# Patient Record
Sex: Female | Born: 1985 | Race: Black or African American | Hispanic: No | Marital: Single | Smoking: Never smoker
Health system: Southern US, Community
[De-identification: ages and names within clinical notes are randomized; demographics above are authoritative.]

## PROBLEM LIST (undated history)

## (undated) DIAGNOSIS — J45909 Unspecified asthma, uncomplicated: Secondary | ICD-10-CM

## (undated) DIAGNOSIS — I10 Essential (primary) hypertension: Secondary | ICD-10-CM

---

## 2019-09-24 ENCOUNTER — Other Ambulatory Visit: Payer: Self-pay | Admitting: Physician Assistant

## 2019-09-24 DIAGNOSIS — E041 Nontoxic single thyroid nodule: Secondary | ICD-10-CM

## 2019-10-07 ENCOUNTER — Other Ambulatory Visit: Payer: Self-pay

## 2019-10-07 ENCOUNTER — Ambulatory Visit
Admission: RE | Admit: 2019-10-07 | Discharge: 2019-10-07 | Disposition: A | Discharge status: Home / Self Care | Payer: Self-pay | Source: Ambulatory Visit | Attending: Physician Assistant | Admitting: Physician Assistant

## 2019-10-07 DIAGNOSIS — E041 Nontoxic single thyroid nodule: Secondary | ICD-10-CM | POA: Insufficient documentation

## 2019-11-03 ENCOUNTER — Emergency Department: Payer: Medicaid Other

## 2019-11-03 ENCOUNTER — Other Ambulatory Visit: Payer: Self-pay

## 2019-11-03 ENCOUNTER — Encounter: Payer: Self-pay | Admitting: Intensive Care

## 2019-11-03 ENCOUNTER — Emergency Department
Admission: EM | Admit: 2019-11-03 | Discharge: 2019-11-03 | Disposition: A | Discharge status: Home / Self Care | Payer: Medicaid Other | Attending: Emergency Medicine | Admitting: Emergency Medicine

## 2019-11-03 DIAGNOSIS — Z20822 Contact with and (suspected) exposure to covid-19: Secondary | ICD-10-CM | POA: Insufficient documentation

## 2019-11-03 DIAGNOSIS — R079 Chest pain, unspecified: Secondary | ICD-10-CM | POA: Insufficient documentation

## 2019-11-03 DIAGNOSIS — I10 Essential (primary) hypertension: Secondary | ICD-10-CM | POA: Insufficient documentation

## 2019-11-03 DIAGNOSIS — J45909 Unspecified asthma, uncomplicated: Secondary | ICD-10-CM | POA: Insufficient documentation

## 2019-11-03 DIAGNOSIS — Z79899 Other long term (current) drug therapy: Secondary | ICD-10-CM | POA: Insufficient documentation

## 2019-11-03 HISTORY — DX: Unspecified asthma, uncomplicated: J45.909

## 2019-11-03 HISTORY — DX: Essential (primary) hypertension: I10

## 2019-11-03 LAB — SARS CORONAVIRUS 2 BY RT PCR (HOSPITAL ORDER, PERFORMED IN ~~LOC~~ HOSPITAL LAB): SARS Coronavirus 2: NEGATIVE

## 2019-11-03 LAB — BASIC METABOLIC PANEL
Anion gap: 11 (ref 5–15)
BUN: 20 mg/dL (ref 6–20)
CO2: 25 mmol/L (ref 22–32)
Calcium: 9.6 mg/dL (ref 8.9–10.3)
Chloride: 101 mmol/L (ref 98–111)
Creatinine, Ser: 1.43 mg/dL — ABNORMAL HIGH (ref 0.44–1.00)
GFR calc Af Amer: 55 mL/min — ABNORMAL LOW (ref >60)
GFR calc non Af Amer: 48 mL/min — ABNORMAL LOW (ref >60)
Glucose, Bld: 104 mg/dL — ABNORMAL HIGH (ref 70–99)
Potassium: 3.8 mmol/L (ref 3.5–5.1)
Sodium: 137 mmol/L (ref 135–145)

## 2019-11-03 LAB — CBC
HCT: 39.2 % (ref 36.0–46.0)
Hemoglobin: 12.8 g/dL (ref 12.0–15.0)
MCH: 26.6 pg (ref 26.0–34.0)
MCHC: 32.7 g/dL (ref 30.0–36.0)
MCV: 81.5 fL (ref 80.0–100.0)
Platelets: 199 10*3/uL (ref 150–400)
RBC: 4.81 MIL/uL (ref 3.87–5.11)
RDW: 16.4 % — ABNORMAL HIGH (ref 11.5–15.5)
WBC: 5.6 10*3/uL (ref 4.0–10.5)
nRBC: 0 % (ref 0.0–0.2)

## 2019-11-03 LAB — TROPONIN I (HIGH SENSITIVITY): Troponin I (High Sensitivity): 5 ng/L (ref <18)

## 2019-11-03 MED ORDER — ACETAMINOPHEN 500 MG PO TABS
1000.0000 mg | ORAL_TABLET | Freq: Once | ORAL | Status: AC
  Administered 2019-11-03: 1000 mg via ORAL
  Filled 2019-11-03: qty 2

## 2019-11-03 NOTE — ED Provider Notes (Signed)
[facility] Emergency Department Provider Note  ____________________________________________   First MD Initiated Contact with Patient 11/03/19 1712     (approximate)  I have reviewed the triage vital signs and the nursing notes.   HISTORY  Chief Complaint Chest Pain   HPI Nancy Copeland is a 34 y.o. female with a past medical history of asthma, hypertension, and amenorrhea of unknown etiology who presents for assessment of substernal sharp chest pain that began yesterday approximately p.m. Patient states he has had prior similar episodes but she feels that this pain has been more persistent than usual. She states it is aggravated by any movement or twisting or talking. She also notes she was recently diagnosed with laryngitis and has been worse for a while. She denies any fevers, chills, cough, shortness of breath, nausea, vomiting, diarrhea, dysuria, abdominal pain, back pain, rash, recent traumatic injuries. She states her triptan is just left external and does not radiate to her arms, jaw, back, or elsewhere. No clear alleviating aggravating factors. Patient denies tobacco abuse, EtOH use, illicit drug use. Does not know if she has any strong family history of CAD. Is not currently on any estrogen supplementation. No history of DVT/PE. HPI: A 34 year old patient with a history of hypertension and obesity presents for evaluation of chest pain. Initial onset of pain was more than 6 hours ago. The patient's chest pain is sharp and is not worse with exertion. The patient's chest pain is middle- or left-sided, is not well-localized, is not described as heaviness/pressure/tightness and does not radiate to the arms/jaw/neck. The patient does not complain of nausea and denies diaphoresis. The patient has no history of stroke, has no history of peripheral artery disease, has not smoked in the past 90 days, denies any history of treated diabetes, has no relevant family history of  coronary artery disease (first degree relative at less than age 53) and has no history of hypercholesterolemia.        Past Medical History:  Diagnosis Date  . Asthma   . Hypertension     There are no problems to display for this patient.   History reviewed. No pertinent surgical history.  Prior to Admission medications   Medication Sig Start Date End Date Taking? Authorizing Provider  albuterol (VENTOLIN HFA) 108 (90 Base) MCG/ACT inhaler Inhale 2 puffs into the lungs every 6 (six) hours as needed for wheezing or shortness of breath.   Yes [provider]  amLODipine (NORVASC) 10 MG tablet Take 10 mg by mouth daily.   Yes [provider]  doxepin (SINEQUAN) 50 MG capsule Take 50 mg by mouth.   Yes [provider]  hydrochlorothiazide (MICROZIDE) 12.5 MG capsule Take 12.5 mg by mouth daily.   Yes [provider]  lisinopril (ZESTRIL) 40 MG tablet Take 40 mg by mouth daily.   Yes [provider]  metoprolol succinate (TOPROL-XL) 50 MG 24 hr tablet Take 50 mg by mouth daily. Take with or immediately following a meal.   Yes [provider]    Allergies Reglan [metoclopramide]  History reviewed. No pertinent family history.  Social History Social History   Tobacco Use  . Smoking status: Never Smoker  . Smokeless tobacco: Never Used  Substance Use Topics  . Alcohol use: Never  . Drug use: Never    Review of Systems  Review of Systems  Constitutional: Negative for chills and fever.  HENT: Negative for sore throat.   Eyes: Negative for pain.  Respiratory:  Negative for cough and stridor.   Cardiovascular: Positive for chest pain.  Gastrointestinal: Negative for vomiting.  Skin: Negative for rash.  Neurological: Negative for seizures, loss of consciousness and headaches.  Psychiatric/Behavioral: Negative for suicidal ideas.  All other systems reviewed and are negative.      ____________________________________________   PHYSICAL EXAM:  VITAL SIGNS: ED Triage Vitals [11/03/19 1533]  Enc Vitals Group     BP (!) 154/92     Pulse Rate 91     Resp 16     Temp 99.2 F (37.3 C)     Temp Source Oral     SpO2 100 %     Weight 204 lb (92.5 kg)     Height 5\' 4"  (1.626 m)     Head Circumference      Peak Flow      Pain Score 8     Pain Loc      Pain Edu?      Excl. in GC?    Vitals:   11/03/19 1533  BP: (!) 154/92  Pulse: 91  Resp: 16  Temp: 99.2 F (37.3 C)  SpO2: 100%   Physical Exam Vitals and nursing note reviewed.  Constitutional:      General: She is not in acute distress.    Appearance: She is well-developed. She is obese.  HENT:     Head: Normocephalic and atraumatic.     Right Ear: External ear normal.     Left Ear: External ear normal.     Nose: Nose normal.     Mouth/Throat:     Mouth: Mucous membranes are moist.  Eyes:     Conjunctiva/sclera: Conjunctivae normal.  Cardiovascular:     Rate and Rhythm: Normal rate and regular rhythm.     Pulses: Normal pulses.     Heart sounds: No murmur heard.   Pulmonary:     Effort: Pulmonary effort is normal. No respiratory distress.     Breath sounds: Normal breath sounds.  Abdominal:     Palpations: Abdomen is soft.     Tenderness: There is no abdominal tenderness.  Musculoskeletal:     Cervical back: Neck supple. No rigidity.  Skin:    General: Skin is warm and dry.     Capillary Refill: Capillary refill takes less than 2 seconds.  Neurological:     General: No focal deficit present.     Mental Status: She is alert and oriented to person, place, and time.  Psychiatric:        Mood and Affect: Mood normal.      ____________________________________________   LABS (all labs ordered are listed, but only abnormal results are displayed)  Labs Reviewed  BASIC METABOLIC PANEL - Abnormal; Notable for the following components:      Result Value   Glucose, Bld 104 (*)     Creatinine, Ser 1.43 (*)    GFR calc non Af Amer 48 (*)    GFR calc Af Amer 55 (*)    All other components within normal limits  CBC - Abnormal; Notable for the following components:   RDW 16.4 (*)    All other components within normal limits  SARS CORONAVIRUS 2 BY RT PCR (HOSPITAL ORDER, PERFORMED IN [facility])  POC URINE PREG, ED  TROPONIN I (HIGH SENSITIVITY)   ________ urine pregnancy test noted to be negative.  ____________________________________  EKG  Normal sinus rhythm with a ventricular rate of 87, normal axis, unremarkable intervals, nonspecific  T wave changes in lead III with no other evidence of acute ischemia or other underlying arrhythmia. ____________________________________________  RADIOLOGY  ED MD interpretation: Unremarkable.  Official radiology report(s): DG Chest 2 View  Result Date: 11/03/2019 CLINICAL DATA:  Chest pain EXAM: CHEST - 2 VIEW COMPARISON:  None. FINDINGS: Lungs are clear. Heart size and pulmonary vascularity are normal. No adenopathy. No pneumothorax. No bone lesions. IMPRESSION: Lungs clear.  Cardiac silhouette within normal limits. Electronically Signed   By: Bretta Bang III M.D.   On: 11/03/2019 16:20    ____________________________________________   PROCEDURES  Procedure(s) performed (including Critical Care):  Procedures   ____________________________________________   INITIAL IMPRESSION / ASSESSMENT AND PLAN / ED COURSE  HEAR Score: 2   Overall patient's history, exam, and ED work-up is most consistent with nonspecific chest wall pain with MSK versus costochondritis with the differentials versus pleurisy.  Patient has no cough, fever, or chest x-ray findings suggestive of pneumonia.  Low suspicion for ACS given patient's here score is 2 with reassuring EKG and nonelevated opponent note was obtained greater than 3 hours after symptom onset.  No rash or history of recent traumatic injuries.  Presentation is not  consistent with dissection.  Low suspicion for PE as patient is PERC negative.  COVID-19 sent.  No evidence of significant electrolyte or metabolic derangements on labs and patient does not appear to be acutely symptomatic from anemia.  Discussed OTC analgesia patient was given Tylenol in ED.  Given stable vital signs with otherwise reassuring work-up you believe patient safe to discharge with plan for close outpatient follow-up.  Discharged stable condition.  Strict return precautions advised and discussed with  Medications  acetaminophen (TYLENOL) tablet 1,000 mg (1,000 mg Oral Given 11/03/19 1757)      ____________________________________________   FINAL CLINICAL IMPRESSION(S) / ED DIAGNOSES  Final diagnoses:  Chest pain, unspecified type  Person under investigation for COVID-19     ED Discharge Orders    None       Note:  This document was prepared using Dragon voice recognition software and may include unintentional dictation errors.   Gilles Chiquito, MD 11/03/19 [phone]

## 2019-11-03 NOTE — ED Triage Notes (Signed)
Patient c/o stabbing chest pain that started last night around 8pm. No radiation. Reports the pain happens with any movement and talking. Denies N/V.

## 2019-11-04 NOTE — ED Notes (Signed)
Patient called for result of covid test .  Gave her neg result.

## 2021-05-30 IMAGING — US US THYROID
1 series · 14 of 25 positions shown · non-contrast
Comparison: None.

CLINICAL DATA: 33-year-old female with a history of thyroid nodules

EXAM:
THYROID ULTRASOUND
TECHNIQUE: Ultrasound examination of the thyroid gland and adjacent soft
tissues was performed.

[Series 1: us thyroid · 65 acquisitions, 14 frames shown]
[im 1/65]
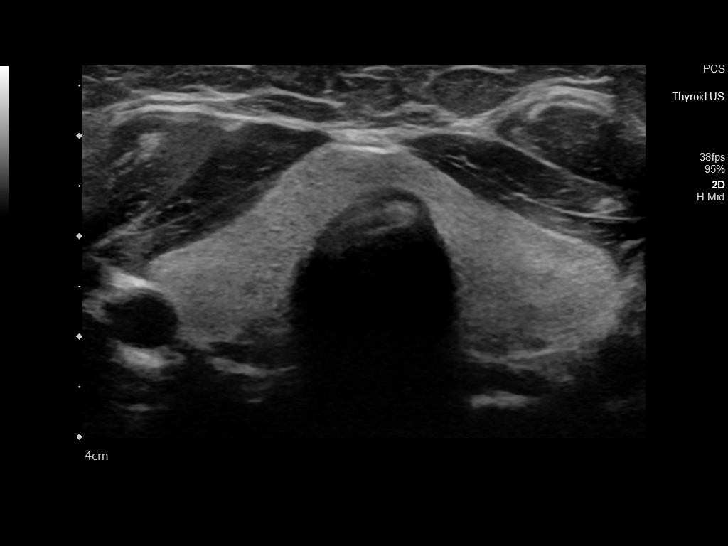
[im 6/65]
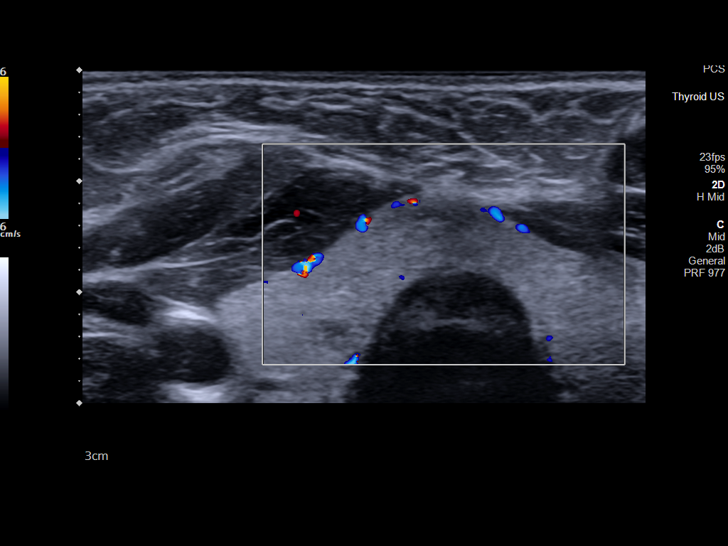
[im 11/65]
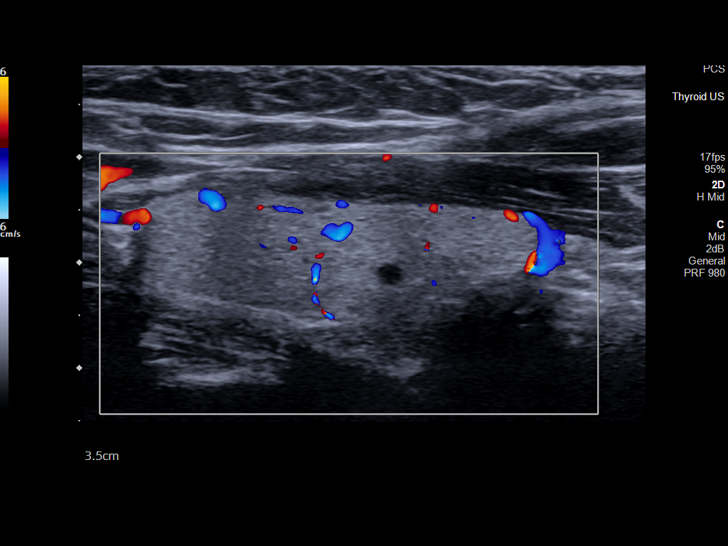
[im 17/65]
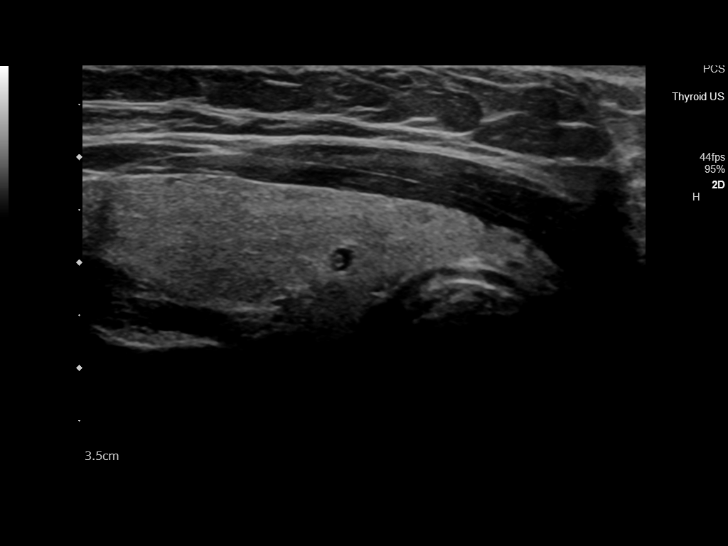
[im 22/65]
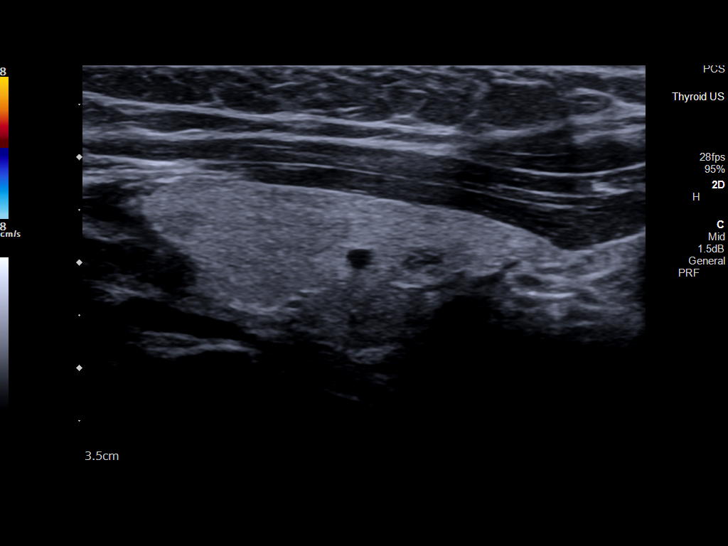
[im 25/65]
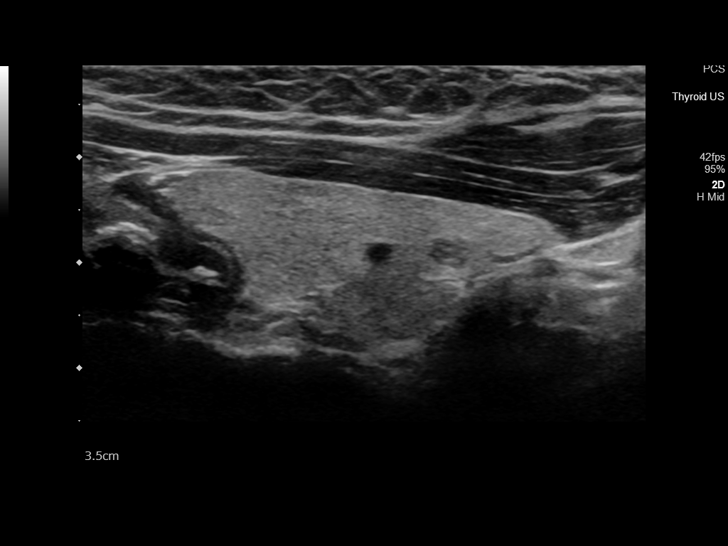
[im 30/65]
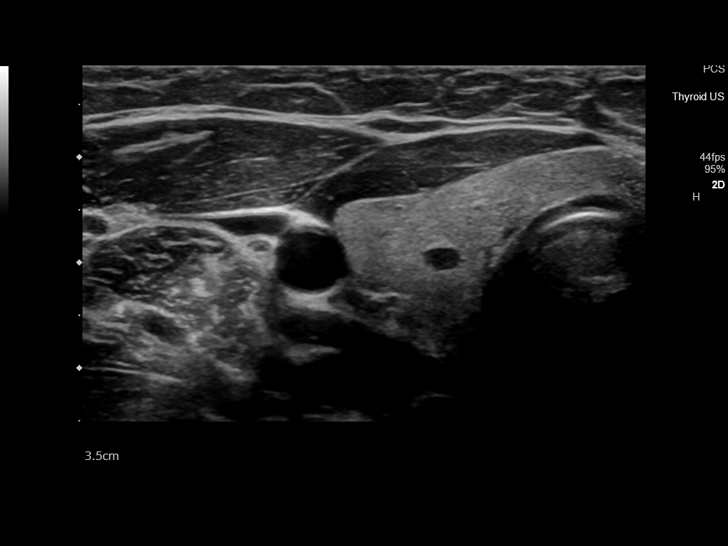
[im 35/65]
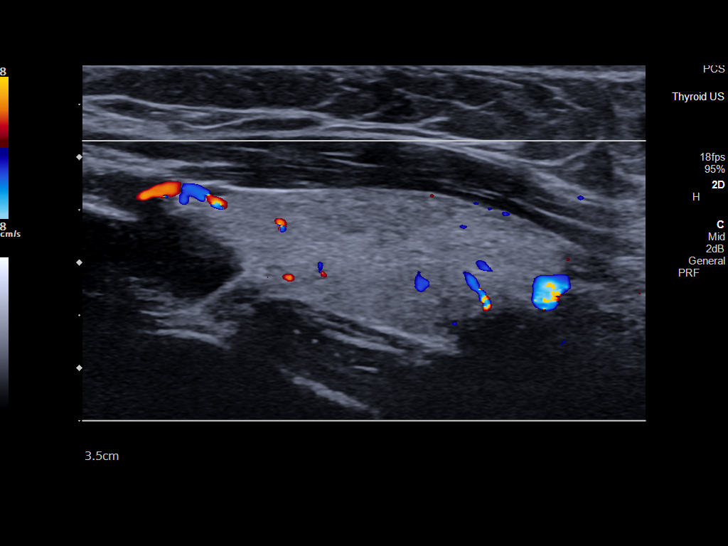
[im 41/65]
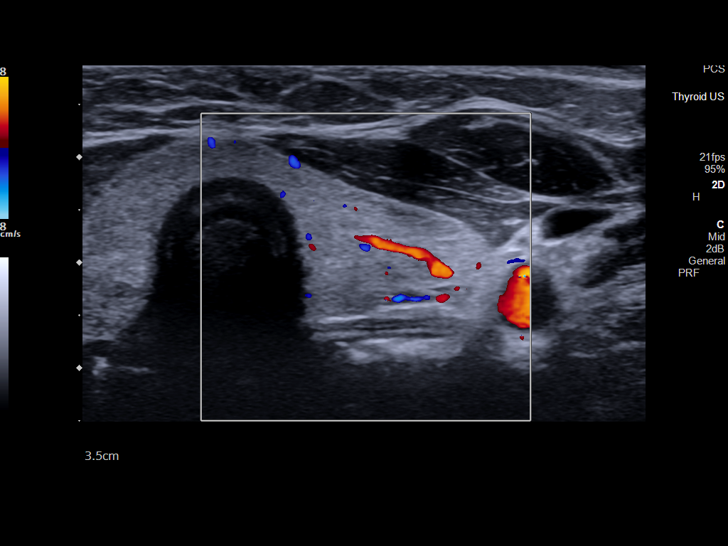
[im 43/65]
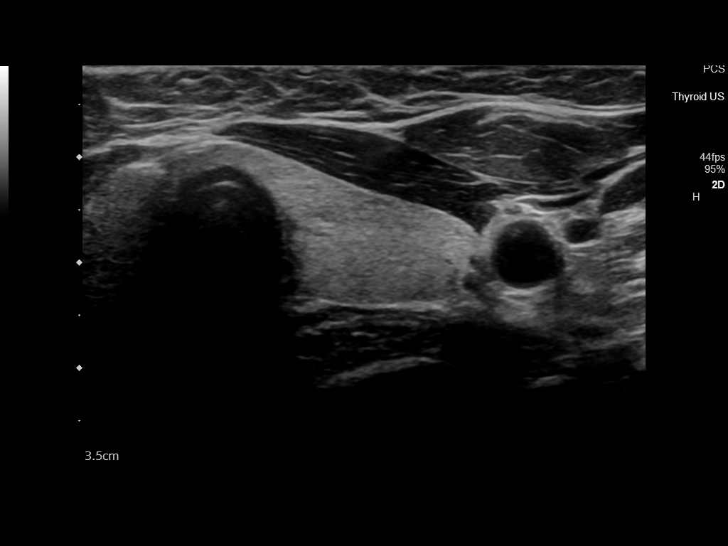
[im 49/65]
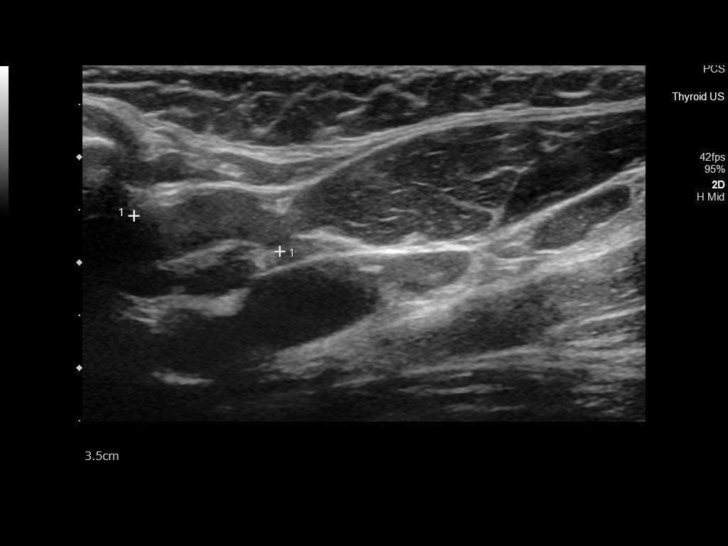
[im 54/65]
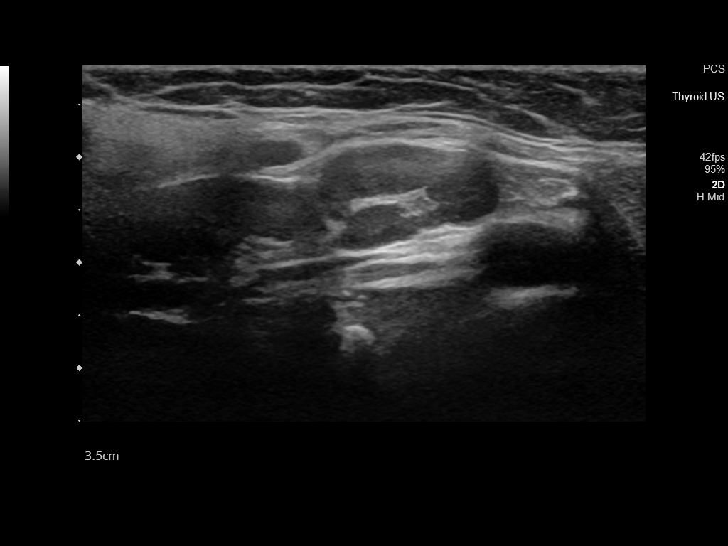
[im 59/65]
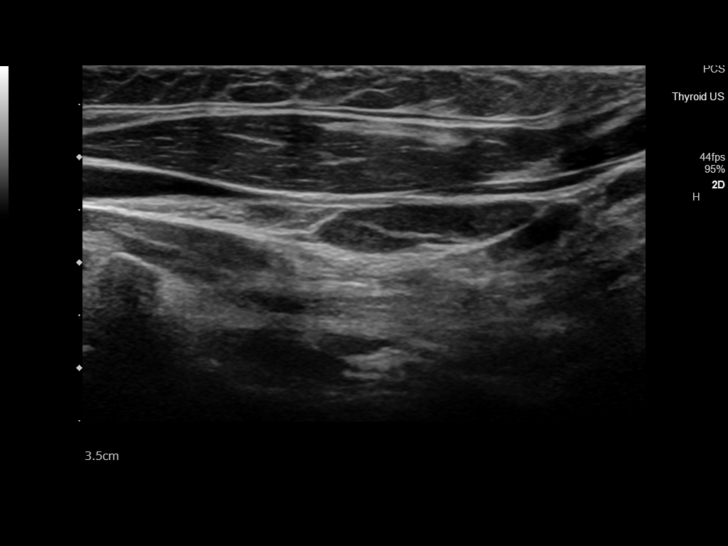
[im 65/65]
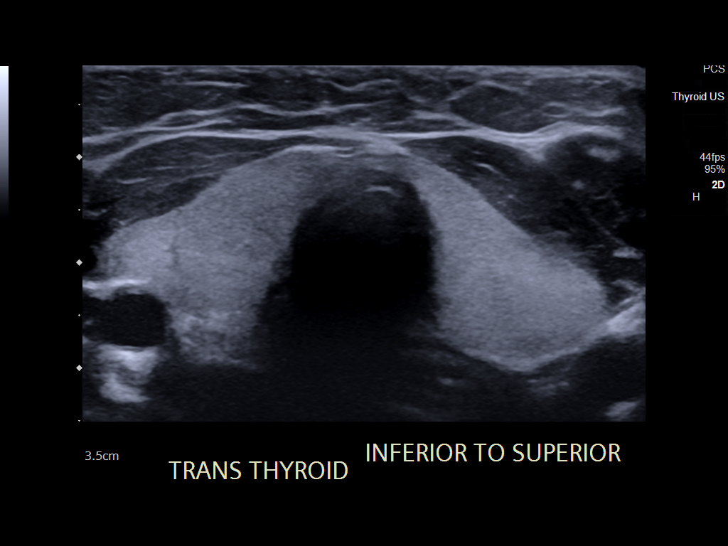

[14 of 25 positions shown; findings below may reference images not displayed]

FINDINGS: Parenchymal Echotexture: Normal

Isthmus: 0.7 cm

Right lobe: 3.7 cm x 1.5 cm x 1.5 cm

Left lobe: 3.8 cm x 1.3 cm x 1.6 cm

_________________________________________________________

Estimated total number of nodules >/= 1 cm: 0

Number of spongiform nodules >/=  2 cm not described below (TR1): 0

Number of mixed cystic and solid nodules >/= 1.5 cm not described
below (TR2): 0

_________________________________________________________

No nodules within the thyroid.

Lymph nodes are present bilateral neck with typical architecture
maintained, borderline enlarged.
IMPRESSION: Relatively unremarkable sonographic survey of the thyroid.

Bilateral cervical lymph nodes, borderline enlarged. These are
nonspecific though potentially reactive.

## 2021-06-26 IMAGING — CR DG CHEST 2V
1 series · 2 of 2 positions shown · non-contrast
Comparison: None.

CLINICAL DATA: Chest pain

EXAM:
CHEST - 2 VIEW

[Series 1: dg chest 2 view · 0.14mm/px · 2 of 2 slices shown]
[im 1/2]
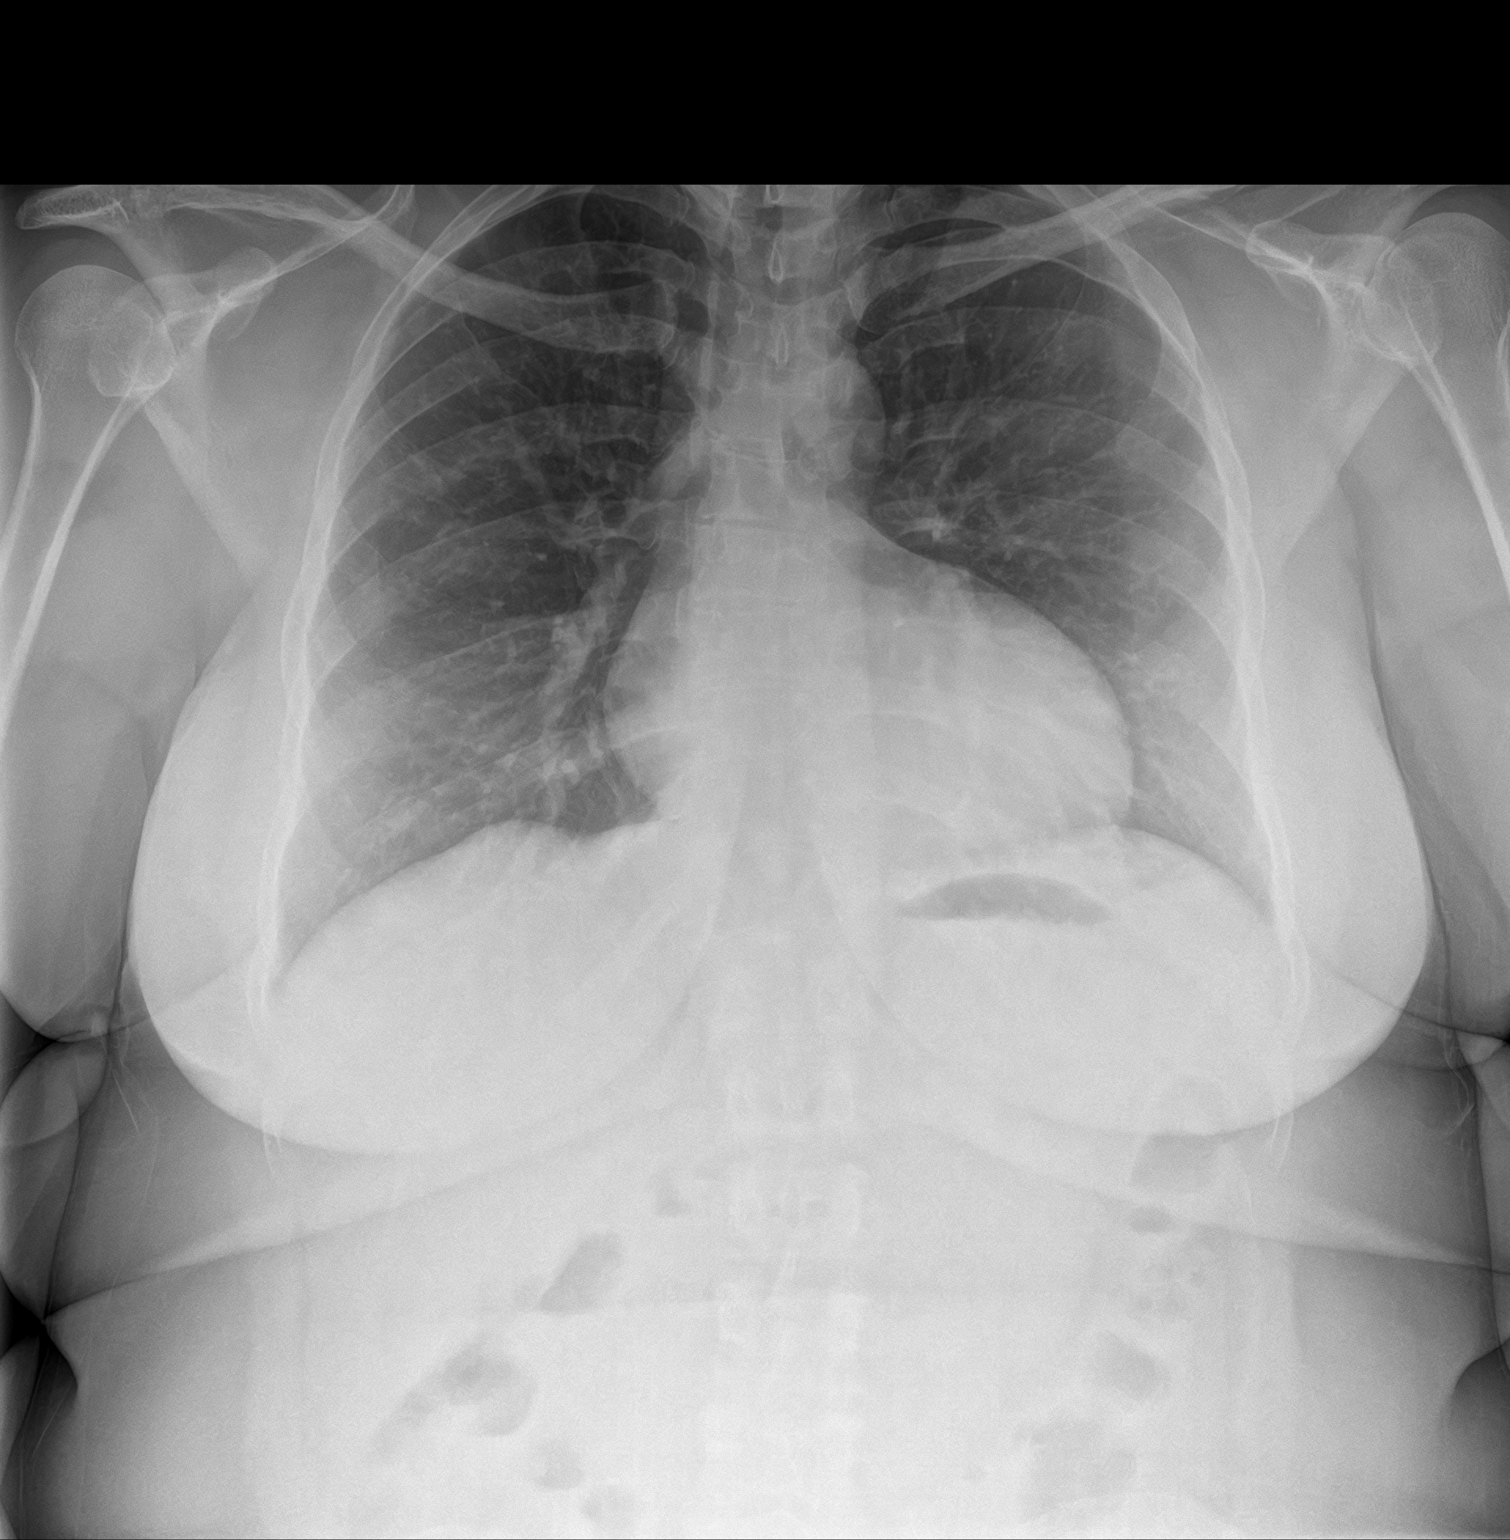
[im 2/2]
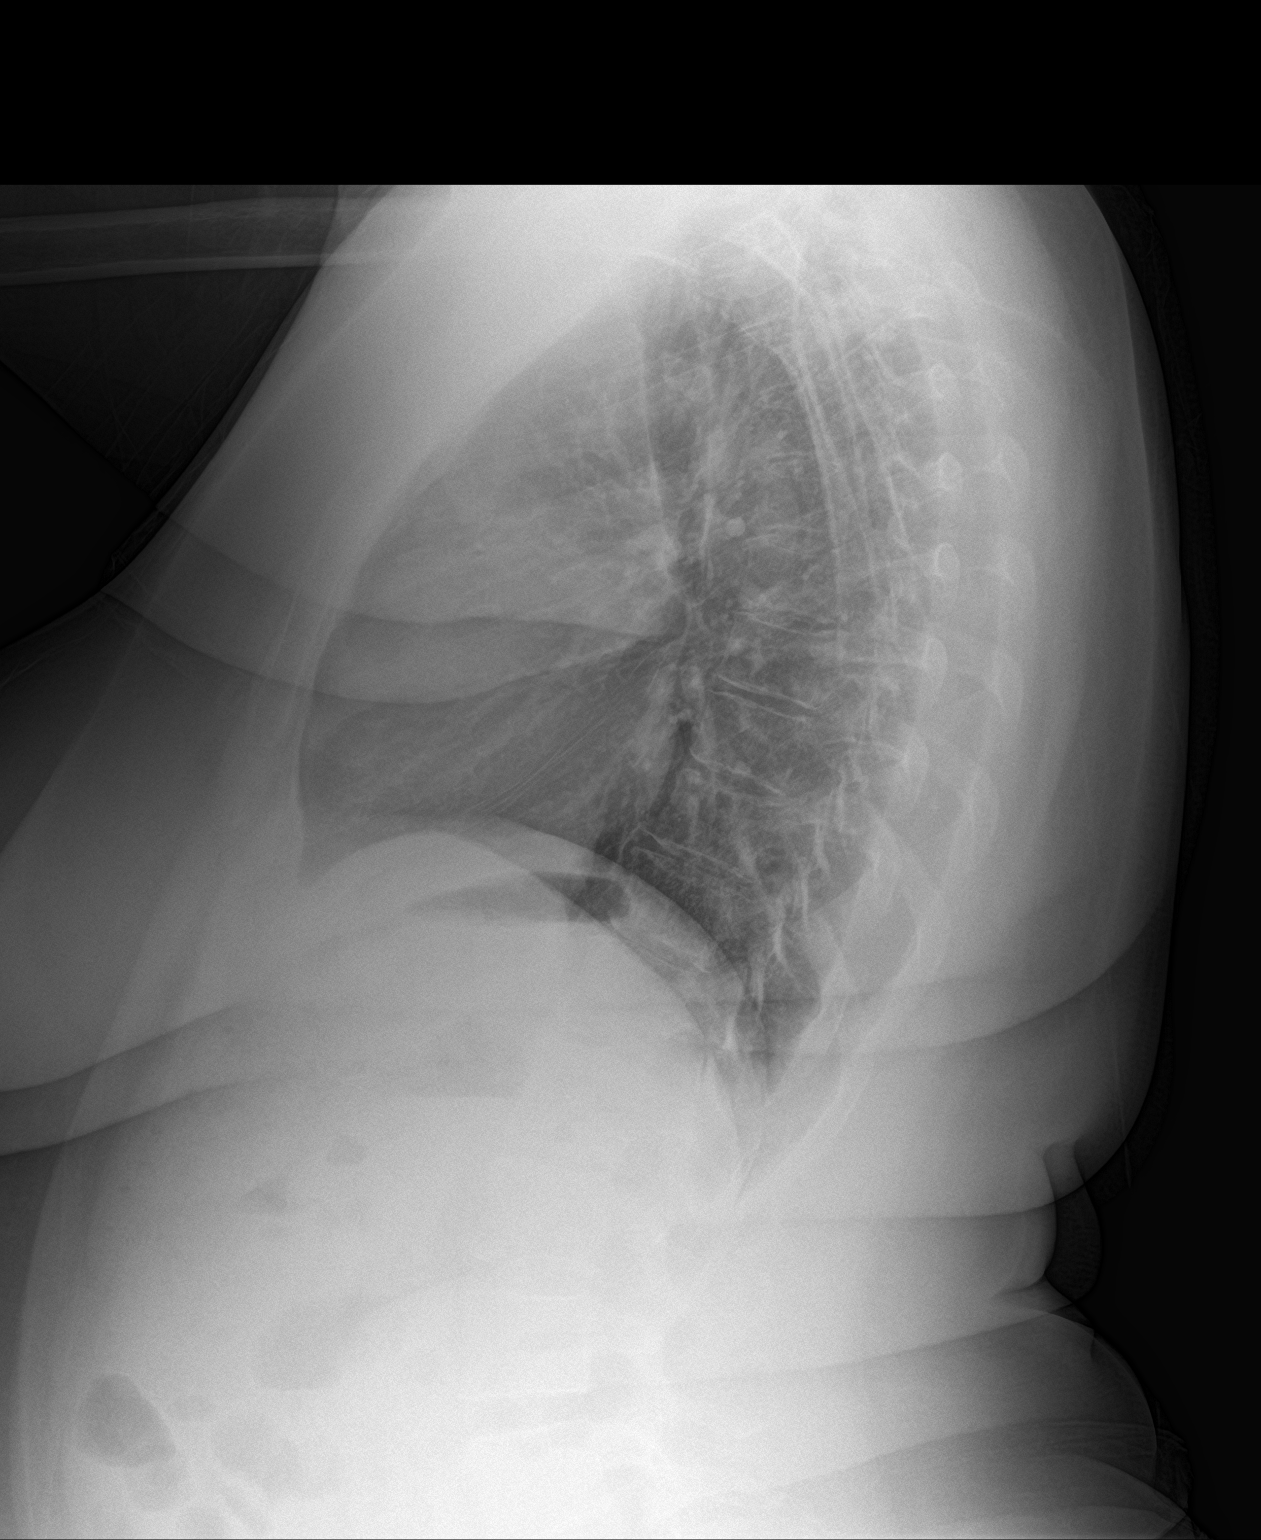

[2 of 2 positions shown; findings below may reference images not displayed]

FINDINGS: Lungs are clear. Heart size and pulmonary vascularity are normal. No
adenopathy. No pneumothorax. No bone lesions.
IMPRESSION: Lungs clear.  Cardiac silhouette within normal limits.
